# Patient Record
Sex: Male | Born: 2005 | Hispanic: No | Marital: Single | Smoking: Never smoker
Health system: Southern US, Community
[De-identification: ages and names within clinical notes are randomized; demographics above are authoritative.]

---
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
## 2025-07-03 ENCOUNTER — Encounter: Payer: Self-pay | Admitting: Family
